# Patient Record
Sex: Male | Born: 2000 | Race: White | Hispanic: No | Marital: Single | Smoking: Current every day smoker
Health system: Southern US, Community
[De-identification: ages and names within clinical notes are randomized; demographics above are authoritative.]

## PROBLEM LIST (undated history)

## (undated) DIAGNOSIS — F909 Attention-deficit hyperactivity disorder, unspecified type: Secondary | ICD-10-CM

## (undated) HISTORY — DX: Attention-deficit hyperactivity disorder, unspecified type: F90.9

---

## 2021-02-12 ENCOUNTER — Other Ambulatory Visit: Payer: Self-pay

## 2021-02-12 ENCOUNTER — Emergency Department (HOSPITAL_COMMUNITY): Payer: No Typology Code available for payment source

## 2021-02-12 ENCOUNTER — Emergency Department (HOSPITAL_COMMUNITY)
Admission: EM | Admit: 2021-02-12 | Discharge: 2021-02-12 | Disposition: A | Discharge status: Home / Self Care | Payer: No Typology Code available for payment source | Attending: Emergency Medicine | Admitting: Emergency Medicine

## 2021-02-12 ENCOUNTER — Encounter (HOSPITAL_COMMUNITY): Payer: Self-pay

## 2021-02-12 DIAGNOSIS — F17210 Nicotine dependence, cigarettes, uncomplicated: Secondary | ICD-10-CM | POA: Diagnosis not present

## 2021-02-12 DIAGNOSIS — S0990XA Unspecified injury of head, initial encounter: Secondary | ICD-10-CM | POA: Insufficient documentation

## 2021-02-12 DIAGNOSIS — S39012A Strain of muscle, fascia and tendon of lower back, initial encounter: Secondary | ICD-10-CM

## 2021-02-12 DIAGNOSIS — Y92410 Unspecified street and highway as the place of occurrence of the external cause: Secondary | ICD-10-CM | POA: Diagnosis not present

## 2021-02-12 DIAGNOSIS — S34109A Unspecified injury to unspecified level of lumbar spinal cord, initial encounter: Secondary | ICD-10-CM | POA: Diagnosis present

## 2021-02-12 MED ORDER — IBUPROFEN 800 MG PO TABS: 800.0000 mg | ORAL_TABLET | Freq: Three times a day (TID) | ORAL | 0 refills | Status: AC | PRN
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
## 2021-02-12 NOTE — ED Notes (Signed)
Pt ambulatory around room, pt talking on cell phone, pt states that he is ready to go home, pt states that his pain is now a 1/10,
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
